# Patient Record
Sex: Male | Born: 1995 | Race: Black or African American | Hispanic: No | Marital: Single | Smoking: Never smoker
Health system: Southern US, Community
[De-identification: ages and names within clinical notes are randomized; demographics above are authoritative.]

---
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
## 2017-05-19 ENCOUNTER — Encounter (HOSPITAL_BASED_OUTPATIENT_CLINIC_OR_DEPARTMENT_OTHER): Payer: Self-pay

## 2017-05-19 ENCOUNTER — Emergency Department (HOSPITAL_BASED_OUTPATIENT_CLINIC_OR_DEPARTMENT_OTHER)
Admission: EM | Admit: 2017-05-19 | Discharge: 2017-05-20 | Disposition: A | Discharge status: Home / Self Care | Payer: 59 | Attending: Emergency Medicine | Admitting: Emergency Medicine

## 2017-05-19 DIAGNOSIS — Y929 Unspecified place or not applicable: Secondary | ICD-10-CM | POA: Diagnosis not present

## 2017-05-19 DIAGNOSIS — T162XXA Foreign body in left ear, initial encounter: Secondary | ICD-10-CM | POA: Diagnosis present

## 2017-05-19 DIAGNOSIS — Y939 Activity, unspecified: Secondary | ICD-10-CM | POA: Insufficient documentation

## 2017-05-19 DIAGNOSIS — W458XXA Other foreign body or object entering through skin, initial encounter: Secondary | ICD-10-CM | POA: Diagnosis not present

## 2017-05-19 DIAGNOSIS — Y999 Unspecified external cause status: Secondary | ICD-10-CM | POA: Insufficient documentation

## 2017-05-19 MED ORDER — LIDOCAINE VISCOUS 2 % MT SOLN
15.0000 mL | Freq: Once | OROMUCOSAL | Status: AC
  Administered 2017-05-19: 15 mL via OROMUCOSAL
  Filled 2017-05-19: qty 15

## 2017-05-19 NOTE — ED Triage Notes (Signed)
C/o moth in left ear x hour

## 2017-05-19 NOTE — ED Provider Notes (Signed)
[facility]   CSN: [id number] Arrival date & time: 05/19/17  2113     History   Chief Complaint Chief Complaint  Patient presents with  . Foreign Body in Ear    HPI Ronnie Griffin is a 21 y.o. male.  HPI  This is a 21 year old male who presents with foreign body in the left ear. Patient reports that he felt something fly into his left ear. He thinks it is a moth. He reports pain mostly when the insect is moving. Currently he is comfortable. Reports 0 out of 10 pain.  History reviewed. No pertinent past medical history.  There are no active problems to display for this patient.   History reviewed. No pertinent surgical history.     Home Medications    Prior to Admission medications   Not on File    Family History No family history on file.  Social History Social History  Substance Use Topics  . Smoking status: Never Smoker  . Smokeless tobacco: Never Used  . Alcohol use No     Allergies   Patient has no known allergies.   Review of Systems Review of Systems  Constitutional: Negative for fever.  HENT: Positive for ear pain. Negative for ear discharge.   All other systems reviewed and are negative.    Physical Exam Updated Vital Signs BP 125/72 (BP Location: Right Arm)   Pulse (!) 118   Temp 98.5 F (36.9 C) (Oral)   Resp 18   Ht 6' (1.829 m)   Wt 60.3 kg (133 lb)   SpO2 99%   BMI 18.04 kg/m   Physical Exam  Constitutional: He is oriented to person, place, and time. He appears well-developed and well-nourished. No distress.  HENT:  Head: Normocephalic and atraumatic.  Moth visualized in left ear, obscuring TM  Cardiovascular: Normal rate and regular rhythm.   Pulmonary/Chest: Effort normal. No respiratory distress.  Neurological: He is alert and oriented to person, place, and time.  Skin: Skin is warm and dry.  Psychiatric: He has a normal mood and affect.  Nursing note and vitals reviewed.    ED Treatments  / Results  Labs (all labs ordered are listed, but only abnormal results are displayed) Labs Reviewed - No data to display  EKG  EKG Interpretation None       Radiology No results found.  Procedures .Foreign Body Removal Date/Time: 05/19/2017 11:50 PM Performed by: Shon Baton Authorized by: Shon Baton  Consent: Verbal consent obtained. Risks and benefits: risks, benefits and alternatives were discussed Consent given by: patient Body area: ear Location details: left ear Anesthesia: see MAR for details  Anesthesia: Local anesthetic: Viscous lidocaine.  Sedation: Patient sedated: no Patient restrained: no Localization method: visualized Removal mechanism: alligator forceps and irrigation Complexity: simple 1 objects recovered. Objects recovered: moth Post-procedure assessment: foreign body removed Patient tolerance: Patient tolerated the procedure well with no immediate complications Comments: TM intact postprocedure   (including critical care time)  Medications Ordered in ED Medications  lidocaine (XYLOCAINE) 2 % viscous mouth solution 15 mL (not administered)     Initial Impression / Assessment and Plan / ED Course  I have reviewed the triage vital signs and the nursing notes.  Pertinent labs & imaging results that were available during my care of the patient were reviewed by me and considered in my medical decision making (see chart for details).     Patient presents with foreign body left ear. Directly  visualize. Was removed with minimal difficulty. Repeat exam with intact TM.  After history, exam, and medical workup I feel the patient has been appropriately medically screened and is safe for discharge home. Pertinent diagnoses were discussed with the patient. Patient was given return precautions.   Final Clinical Impressions(s) / ED Diagnoses   Final diagnoses:  Foreign body in auricle of left ear, initial encounter    New  Prescriptions New Prescriptions   No medications on file     Shon Baton, MD 05/19/17 2352

## 2022-05-13 DIAGNOSIS — M654 Radial styloid tenosynovitis [de Quervain]: Secondary | ICD-10-CM | POA: Diagnosis not present

## 2022-06-06 DIAGNOSIS — M654 Radial styloid tenosynovitis [de Quervain]: Secondary | ICD-10-CM | POA: Diagnosis not present

## 2022-06-06 DIAGNOSIS — M25531 Pain in right wrist: Secondary | ICD-10-CM | POA: Diagnosis not present

## 2022-08-01 DIAGNOSIS — M654 Radial styloid tenosynovitis [de Quervain]: Secondary | ICD-10-CM | POA: Diagnosis not present

## 2023-09-19 DIAGNOSIS — R0789 Other chest pain: Secondary | ICD-10-CM | POA: Diagnosis not present
